# Patient Record
Sex: Female | Born: 1970 | Race: White | Hispanic: No | Marital: Married | Smoking: Never smoker
Health system: Southern US, Community
[De-identification: ages and names within clinical notes are randomized; demographics above are authoritative.]

## PROBLEM LIST (undated history)

## (undated) DIAGNOSIS — L309 Dermatitis, unspecified: Secondary | ICD-10-CM

## (undated) DIAGNOSIS — J302 Other seasonal allergic rhinitis: Secondary | ICD-10-CM

## (undated) HISTORY — PX: ADENOIDECTOMY: SUR15

## (undated) HISTORY — DX: Dermatitis, unspecified: L30.9

## (undated) HISTORY — DX: Other seasonal allergic rhinitis: J30.2

## (undated) HISTORY — PX: WISDOM TOOTH EXTRACTION: SHX21

## (undated) HISTORY — PX: TUBAL LIGATION: SHX77

## (undated) HISTORY — PX: ABLATION ON ENDOMETRIOSIS: SHX5787

---

## 2000-01-16 ENCOUNTER — Ambulatory Visit (HOSPITAL_COMMUNITY): Admission: RE | Admit: 2000-01-16 | Discharge: 2000-01-16 | Payer: Self-pay | Admitting: Obstetrics and Gynecology

## 2000-01-16 ENCOUNTER — Encounter: Payer: Self-pay | Admitting: Obstetrics and Gynecology

## 2001-02-24 ENCOUNTER — Other Ambulatory Visit: Admission: RE | Admit: 2001-02-24 | Discharge: 2001-02-24 | Payer: Self-pay | Admitting: Obstetrics and Gynecology

## 2002-05-10 ENCOUNTER — Other Ambulatory Visit: Admission: RE | Admit: 2002-05-10 | Discharge: 2002-05-10 | Payer: Self-pay | Admitting: Obstetrics and Gynecology

## 2003-10-06 ENCOUNTER — Other Ambulatory Visit: Admission: RE | Admit: 2003-10-06 | Discharge: 2003-10-06 | Payer: Self-pay | Admitting: Obstetrics and Gynecology

## 2004-10-01 ENCOUNTER — Other Ambulatory Visit: Admission: RE | Admit: 2004-10-01 | Discharge: 2004-10-01 | Payer: Self-pay | Admitting: Obstetrics and Gynecology

## 2005-01-15 ENCOUNTER — Ambulatory Visit (HOSPITAL_COMMUNITY): Admission: RE | Admit: 2005-01-15 | Discharge: 2005-01-15 | Payer: Self-pay | Admitting: Obstetrics and Gynecology

## 2005-03-13 ENCOUNTER — Ambulatory Visit (HOSPITAL_COMMUNITY): Admission: RE | Admit: 2005-03-13 | Discharge: 2005-03-13 | Payer: Self-pay | Admitting: Obstetrics and Gynecology

## 2005-03-18 ENCOUNTER — Inpatient Hospital Stay (HOSPITAL_COMMUNITY): Admission: RE | Admit: 2005-03-18 | Discharge: 2005-03-21 | Payer: Self-pay | Admitting: Obstetrics and Gynecology

## 2005-04-30 ENCOUNTER — Other Ambulatory Visit: Admission: RE | Admit: 2005-04-30 | Discharge: 2005-04-30 | Payer: Self-pay | Admitting: Obstetrics and Gynecology

## 2015-05-18 ENCOUNTER — Encounter: Payer: Self-pay | Admitting: Pediatrics

## 2015-05-18 ENCOUNTER — Ambulatory Visit (INDEPENDENT_AMBULATORY_CARE_PROVIDER_SITE_OTHER): Payer: No Typology Code available for payment source | Admitting: Pediatrics

## 2015-05-18 VITALS — BP 108/72 | HR 90 | Temp 98.9°F | Resp 16 | Ht 68.5 in | Wt 171.7 lb

## 2015-05-18 DIAGNOSIS — J3089 Other allergic rhinitis: Secondary | ICD-10-CM

## 2015-05-18 DIAGNOSIS — J0100 Acute maxillary sinusitis, unspecified: Secondary | ICD-10-CM | POA: Diagnosis not present

## 2015-05-18 MED ORDER — CEFDINIR 300 MG PO CAPS: ORAL_CAPSULE | ORAL | Status: AC

## 2015-05-18 NOTE — Patient Instructions (Addendum)
Environmental control of dust and mold Fexofenadine 180 mg once a day for runny nose Nasal saline irrigations at night followed by Nasacort 2 sprays per nostril at night Cefdinir 300 mg capsules-take 2 capsules every 24 hours for a sinus infection for 10 days If your symptoms are not improving, add prednisone 10 mg twice a day for 4 days, 10 mg on the fifth day Keep the [place] pig out of your  bedroom

## 2015-05-18 NOTE — Progress Notes (Signed)
[address] [place] [address] Dept: [phone]  New Patient Note  Patient ID: Becky Cole, female    DOB: 1970-04-14  Age: 45 y.o. MRN: [id number] Date of Office Visit: 05/18/2015 Referring provider: Candice Camp, MD [address] [place], [place] [address]    Chief Complaint: Urticaria  HPI Becky Cole presents for Evaluation of nasal congestion for several years. Her symptoms are perennial. She has aggravation of her symptoms on exposure to dust and the springtime of the year. Recently her [place] pig licked  her and she developed hives locally. She had a cold 10 days ago and is now having a green postnasal drainage and some sinus pressure  Review of Systems  Constitutional: Negative.   HENT:       Perennial nasal congestion for several years  Eyes: Negative.   Respiratory: Negative.   Cardiovascular: Negative.   Gastrointestinal: Negative.   Genitourinary:       Ablations surgery for endometriosis  Musculoskeletal: Negative.   Skin:       Hives where she was licked by a [place] pig  Neurological: Negative.   Endo/Heme/Allergies:       No thyroid disease or diabetes  Psychiatric/Behavioral: Negative.     Outpatient Encounter Prescriptions as of 05/18/2015  Medication Sig  . cefdinir (OMNICEF) 300 MG capsule Take 2 capsules every 24 hours for sinus infection for 10 days  . fexofenadine (ALLEGRA) 180 MG tablet Take 180 mg by mouth daily.  . phentermine (ADIPEX-P) 37.5 MG tablet   . triamcinolone (NASACORT ALLERGY 24HR) 55 MCG/ACT AERO nasal inhaler Place 2 sprays into the nose daily.   No facility-administered encounter medications on file as of 05/18/2015.     Drug Allergies:  Allergies  Allergen Reactions  . Amoxicillin Nausea And Vomiting  . Clarithromycin     Taste buds swelling    Family History: [name]'s family history includes Allergic rhinitis in her father and sister. There is no history of Angioedema, Asthma, Atopy,  Eczema, Immunodeficiency, or Urticaria..  Social and environmental. She has a [place] pig and a parakeet at home. She has never smoked cigarettes. She is not employed outside the home  Physical Exam: BP 108/72 mmHg  Pulse 90  Temp(Src) 98.9 F (37.2 C) (Oral)  Resp 16  Ht 5' 8.5" (1.74 m)  Wt 171 lb 11.8 oz (77.9 kg)  BMI 25.73 kg/m2   Physical Exam  Constitutional: She is oriented to person, place, and time. She appears well-developed and well-nourished.  HENT:  Eyes normal. Ears normal. Nose moderate swelling of nasal turbinates. Pharynx normal except for a green postnasal drainage  Neck: Neck supple.  Cardiovascular:  S1 and S2 normal no murmurs  Pulmonary/Chest:  Clear to percussion and auscultation  Abdominal: Soft. There is no tenderness (no hepatosplenomegaly).  Lymphadenopathy:    She has no cervical adenopathy.  Neurological: She is alert and oriented to person, place, and time.  Skin:  Clear  Psychiatric: She has a normal mood and affect. Her behavior is normal. Judgment and thought content normal.  Vitals reviewed.   Diagnostics:  Allergy skin tests were positive to  molds and [place] pig  Assessment Assessment and Plan: 1. Other allergic rhinitis   2. Acute maxillary sinusitis, recurrence not specified     Meds ordered this encounter  Medications  . cefdinir (OMNICEF) 300 MG capsule    Sig: Take 2 capsules every 24 hours for sinus infection for 10 days    Dispense:  20 capsule    Refill:  0    Patient Instructions  Environmental control of dust and mold Fexofenadine 180 mg once a day for runny nose Nasal saline irrigations at night followed by Nasacort 2 sprays per nostril at night Cefdinir 300 mg capsules-take 2 capsules every 24 hours for a sinus infection for 10 days If your symptoms are not improving, add prednisone 10 mg twice a day for 4 days, 10 mg on the fifth day Keep the [place] pig out of your  bedroom    Return in about 6 weeks (around  06/29/2015).   Thank you for the opportunity to care for this patient.  Please do not hesitate to contact me with questions.  Tonette BihariJ. A. Sohum Delillo, M.D.  [facility] [address] [place], [place] [address] [phone]

## 2015-06-14 ENCOUNTER — Telehealth: Payer: Self-pay | Admitting: Allergy

## 2015-06-14 NOTE — Telephone Encounter (Signed)
Becky Cole CALLED AND WANTED US TO MAIL HER ALLERGY TESTING TO HER. MAILED ON 06/15/15.

## 2015-07-04 ENCOUNTER — Ambulatory Visit (INDEPENDENT_AMBULATORY_CARE_PROVIDER_SITE_OTHER): Payer: No Typology Code available for payment source | Admitting: Pediatrics

## 2015-07-04 ENCOUNTER — Encounter: Payer: Self-pay | Admitting: Pediatrics

## 2015-07-04 VITALS — BP 110/72 | HR 96 | Temp 98.7°F | Resp 20

## 2015-07-04 DIAGNOSIS — J3081 Allergic rhinitis due to animal (cat) (dog) hair and dander: Secondary | ICD-10-CM | POA: Insufficient documentation

## 2015-07-04 NOTE — Patient Instructions (Addendum)
Continue on your current medications Call me if you're not doing well on this treatment plan.  Keep the [place] pig out of her bedroom

## 2015-07-04 NOTE — Progress Notes (Signed)
  [address] [place] [address] Dept: [phone]  FOLLOW UP NOTE  Patient ID: Becky Cole, female    DOB: 1970/04/27  Age: 45 y.o. MRN: [id number] Date of Office Visit: 07/04/2015  Assessment Chief Complaint: Follow-up  HPI Becky Cole presents for for follow-up of allergic rhinitis. She had a sinus infection noted during the last visit and she took  Cefdinir 600 mg every 24 hours and 5 days later she developed some diarrhea so she stopped it. Her symptoms have been much improved. She did use prednisone 10 mg twice a day for 4 days 10 mg on the fifth day.  Current medications are fexofenadine 180 mg in the morning, Nasacort 2 sprays per nostril at night Phentermine 37.5 mg once a day   Drug Allergies:  Allergies  Allergen Reactions  . Amoxicillin Nausea And Vomiting  . Cefdinir     Upset stomach, diarrhea  . Clarithromycin     Taste buds swelling    Physical Exam: BP 110/72 mmHg  Pulse 96  Temp(Src) 98.7 F (37.1 C) (Oral)  Resp 20   Physical Exam  Constitutional: She appears well-developed and well-nourished.  HENT:  Eyes normal. Ears normal. Nose normal. Pharynx normal.  Neck: Neck supple.  Cardiovascular:  S1 and S2 normal no murmurs  Pulmonary/Chest:  Clear to percussion and auscultation  Lymphadenopathy:    She has no cervical adenopathy.  Psychiatric: She has a normal mood and affect. Her behavior is normal. Judgment and thought content normal.  Vitals reviewed.   Diagnostics:  none  Assessment and Plan: 1. Allergic rhinitis due to animal hair and dander         Patient Instructions  Continue on your current medications Call me if you're not doing well on this treatment plan    Return in about 1 year (around 07/03/2016).    Thank you for the opportunity to care for this patient.  Please do not hesitate to contact me with questions.  Tonette BihariJ. A. Deema Juncaj, M.D.  [facility] [address] [place], [place] [address] [phone]

## 2018-04-27 ENCOUNTER — Other Ambulatory Visit: Payer: Self-pay | Admitting: Obstetrics and Gynecology

## 2018-04-27 DIAGNOSIS — R928 Other abnormal and inconclusive findings on diagnostic imaging of breast: Secondary | ICD-10-CM

## 2018-04-28 ENCOUNTER — Other Ambulatory Visit: Payer: Self-pay

## 2018-04-28 ENCOUNTER — Ambulatory Visit
Admission: RE | Admit: 2018-04-28 | Discharge: 2018-04-28 | Disposition: A | Discharge status: Home / Self Care | Payer: PRIVATE HEALTH INSURANCE | Source: Ambulatory Visit | Attending: Obstetrics and Gynecology | Admitting: Obstetrics and Gynecology

## 2018-04-28 ENCOUNTER — Other Ambulatory Visit: Payer: Self-pay | Admitting: Obstetrics and Gynecology

## 2018-04-28 DIAGNOSIS — R928 Other abnormal and inconclusive findings on diagnostic imaging of breast: Secondary | ICD-10-CM

## 2018-04-28 DIAGNOSIS — R921 Mammographic calcification found on diagnostic imaging of breast: Secondary | ICD-10-CM

## 2018-11-02 ENCOUNTER — Ambulatory Visit
Admission: RE | Admit: 2018-11-02 | Discharge: 2018-11-02 | Disposition: A | Discharge status: Home / Self Care | Payer: PRIVATE HEALTH INSURANCE | Source: Ambulatory Visit | Attending: Obstetrics and Gynecology | Admitting: Obstetrics and Gynecology

## 2018-11-02 ENCOUNTER — Other Ambulatory Visit: Payer: Self-pay

## 2018-11-02 ENCOUNTER — Other Ambulatory Visit: Payer: Self-pay | Admitting: Obstetrics and Gynecology

## 2018-11-02 DIAGNOSIS — R921 Mammographic calcification found on diagnostic imaging of breast: Secondary | ICD-10-CM

## 2019-05-10 ENCOUNTER — Ambulatory Visit
Admission: RE | Admit: 2019-05-10 | Discharge: 2019-05-10 | Disposition: A | Discharge status: Home / Self Care | Payer: PRIVATE HEALTH INSURANCE | Source: Ambulatory Visit | Attending: Obstetrics and Gynecology | Admitting: Obstetrics and Gynecology

## 2019-05-10 ENCOUNTER — Other Ambulatory Visit: Payer: Self-pay

## 2019-05-10 DIAGNOSIS — R921 Mammographic calcification found on diagnostic imaging of breast: Secondary | ICD-10-CM

## 2019-11-19 ENCOUNTER — Other Ambulatory Visit: Payer: Self-pay | Admitting: Obstetrics and Gynecology

## 2019-11-19 DIAGNOSIS — R921 Mammographic calcification found on diagnostic imaging of breast: Secondary | ICD-10-CM

## 2020-05-11 ENCOUNTER — Ambulatory Visit
Admission: RE | Admit: 2020-05-11 | Discharge: 2020-05-11 | Disposition: A | Discharge status: Home / Self Care | Payer: PRIVATE HEALTH INSURANCE | Source: Ambulatory Visit | Attending: Obstetrics and Gynecology | Admitting: Obstetrics and Gynecology

## 2020-05-11 ENCOUNTER — Other Ambulatory Visit: Payer: Self-pay

## 2020-05-11 DIAGNOSIS — R921 Mammographic calcification found on diagnostic imaging of breast: Secondary | ICD-10-CM

## 2021-07-02 ENCOUNTER — Other Ambulatory Visit: Payer: Self-pay | Admitting: Obstetrics and Gynecology

## 2021-07-02 DIAGNOSIS — R928 Other abnormal and inconclusive findings on diagnostic imaging of breast: Secondary | ICD-10-CM

## 2021-07-11 ENCOUNTER — Ambulatory Visit
Admission: RE | Admit: 2021-07-11 | Discharge: 2021-07-11 | Disposition: A | Discharge status: Home / Self Care | Payer: PRIVATE HEALTH INSURANCE | Source: Ambulatory Visit | Attending: Obstetrics and Gynecology | Admitting: Obstetrics and Gynecology

## 2021-07-11 ENCOUNTER — Other Ambulatory Visit: Payer: Self-pay | Admitting: Obstetrics and Gynecology

## 2021-07-11 ENCOUNTER — Ambulatory Visit
Admission: RE | Admit: 2021-07-11 | Discharge: 2021-07-11 | Disposition: A | Discharge status: Home / Self Care | Payer: Self-pay | Source: Ambulatory Visit | Attending: Obstetrics and Gynecology | Admitting: Obstetrics and Gynecology

## 2021-07-11 DIAGNOSIS — R928 Other abnormal and inconclusive findings on diagnostic imaging of breast: Secondary | ICD-10-CM

## 2021-07-11 DIAGNOSIS — N6320 Unspecified lump in the left breast, unspecified quadrant: Secondary | ICD-10-CM

## 2021-07-17 ENCOUNTER — Ambulatory Visit
Admission: RE | Admit: 2021-07-17 | Discharge: 2021-07-17 | Disposition: A | Discharge status: Home / Self Care | Payer: PRIVATE HEALTH INSURANCE | Source: Ambulatory Visit | Attending: Obstetrics and Gynecology | Admitting: Obstetrics and Gynecology

## 2021-07-17 DIAGNOSIS — N6320 Unspecified lump in the left breast, unspecified quadrant: Secondary | ICD-10-CM

## 2021-12-19 ENCOUNTER — Other Ambulatory Visit: Payer: Self-pay | Admitting: Obstetrics and Gynecology

## 2021-12-19 DIAGNOSIS — R921 Mammographic calcification found on diagnostic imaging of breast: Secondary | ICD-10-CM

## 2022-02-12 ENCOUNTER — Ambulatory Visit
Admission: RE | Admit: 2022-02-12 | Discharge: 2022-02-12 | Disposition: A | Discharge status: Home / Self Care | Payer: PRIVATE HEALTH INSURANCE | Source: Ambulatory Visit | Attending: Obstetrics and Gynecology | Admitting: Obstetrics and Gynecology

## 2022-02-12 ENCOUNTER — Ambulatory Visit: Payer: PRIVATE HEALTH INSURANCE

## 2022-02-12 DIAGNOSIS — R921 Mammographic calcification found on diagnostic imaging of breast: Secondary | ICD-10-CM

## 2022-06-19 ENCOUNTER — Encounter: Payer: Self-pay | Admitting: Obstetrics and Gynecology

## 2022-06-19 DIAGNOSIS — Z1231 Encounter for screening mammogram for malignant neoplasm of breast: Secondary | ICD-10-CM

## 2022-10-28 IMAGING — US US BREAST BX W LOC DEV 1ST LESION IMG BX SPEC US GUIDE*L*
1 series · 12 of 12 positions shown · non-contrast
Comparison: None Available.
COMPARISON: None Available.

Addendum:
CLINICAL DATA: 50-year-old female presenting for ultrasound-guided
biopsy of a left breast mass.

EXAM:
ULTRASOUND GUIDED LEFT BREAST CORE NEEDLE BIOPSY

[Series 1: us breast bx w loc dev 1st lesion img bx spec us g · 0.06mm/px · 12 of 12 slices shown]
[im 1/12]
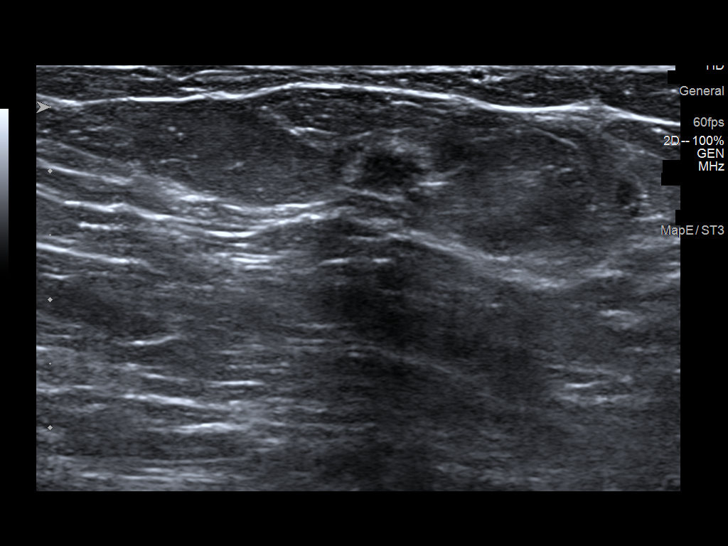
[im 2/12]
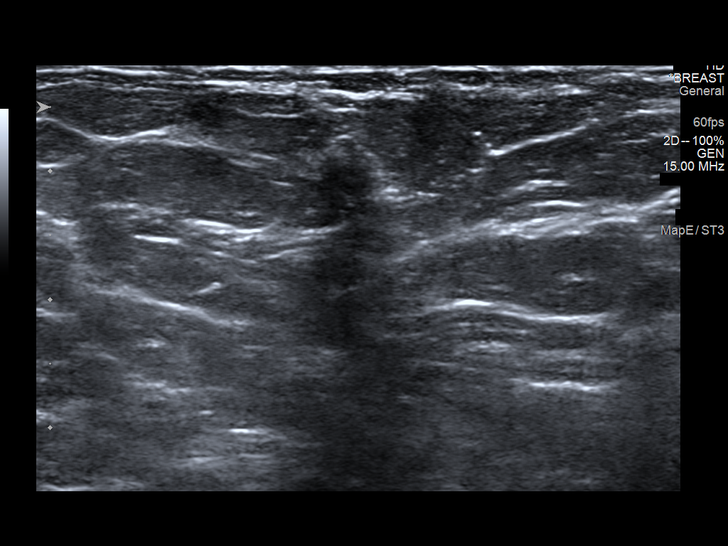
[im 3/12]
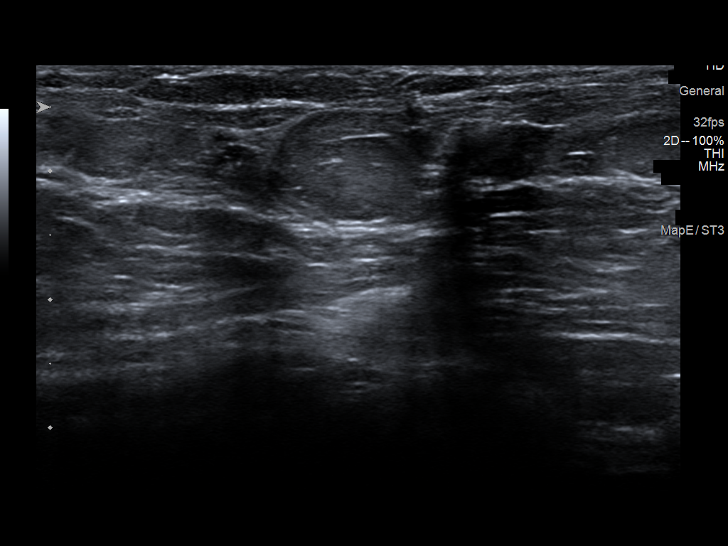
[im 4/12]
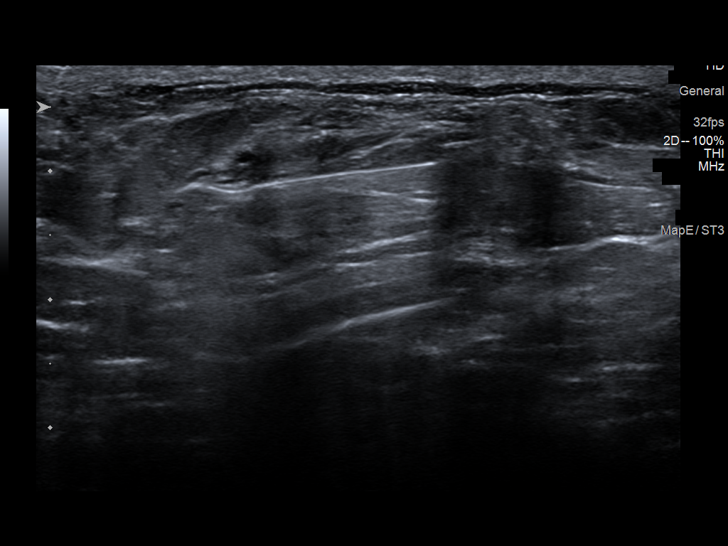
[im 5/12]
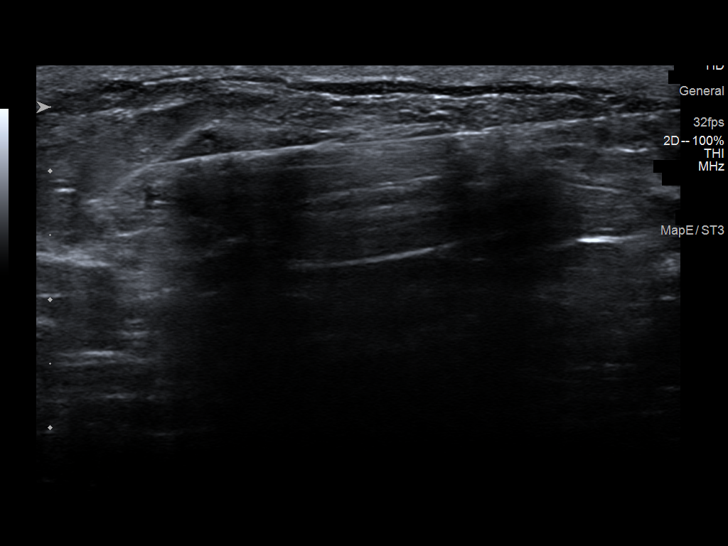
[im 6/12]
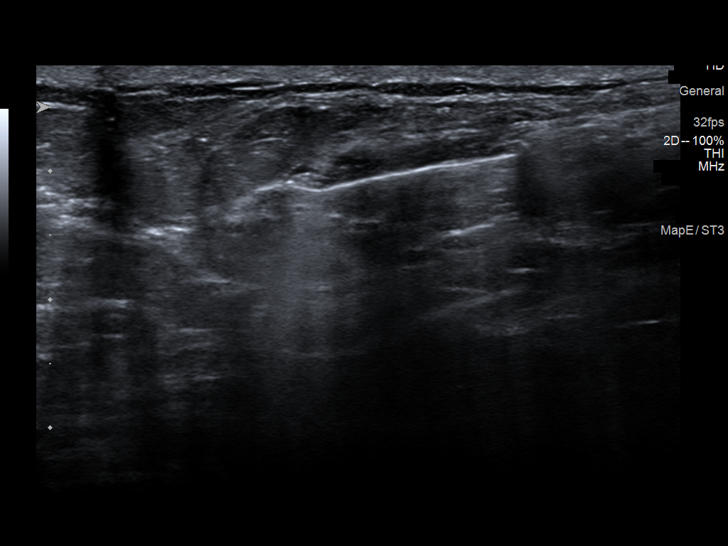
[im 7/12]
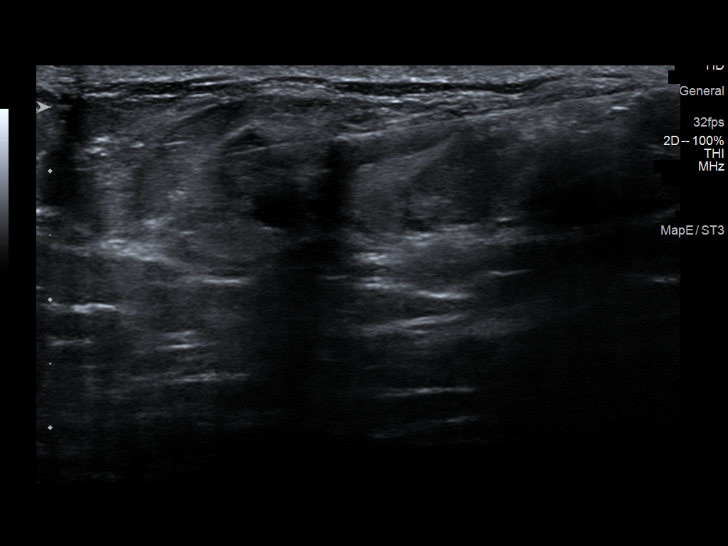
[im 8/12]
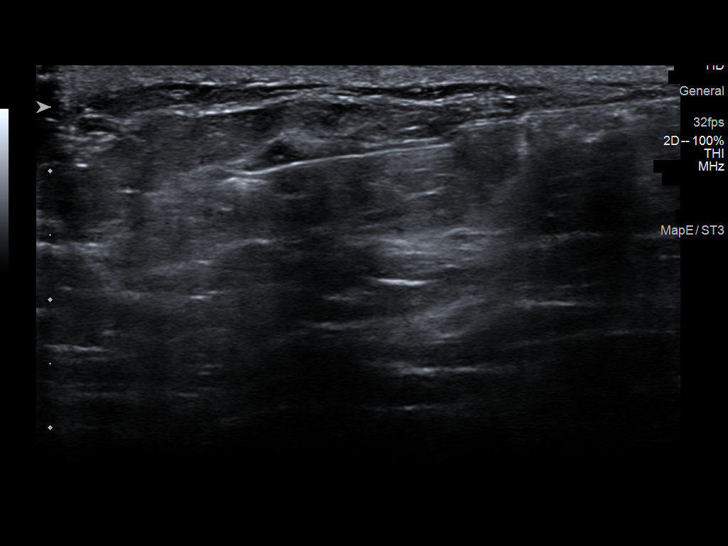
[im 9/12]
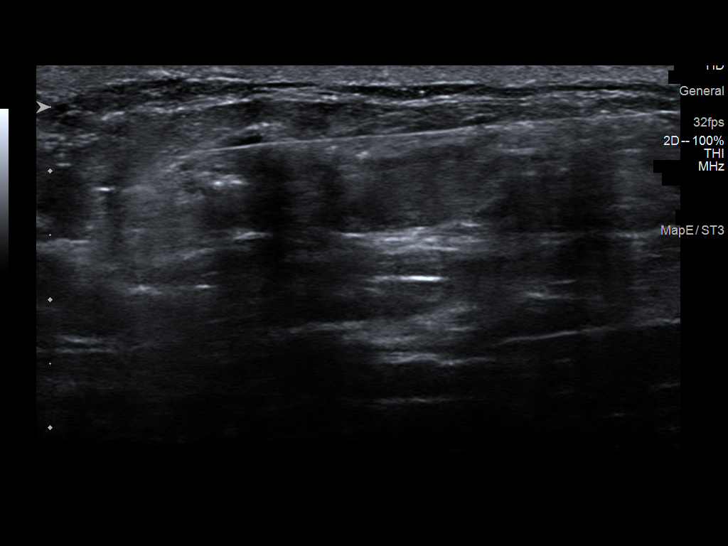
[im 10/12]
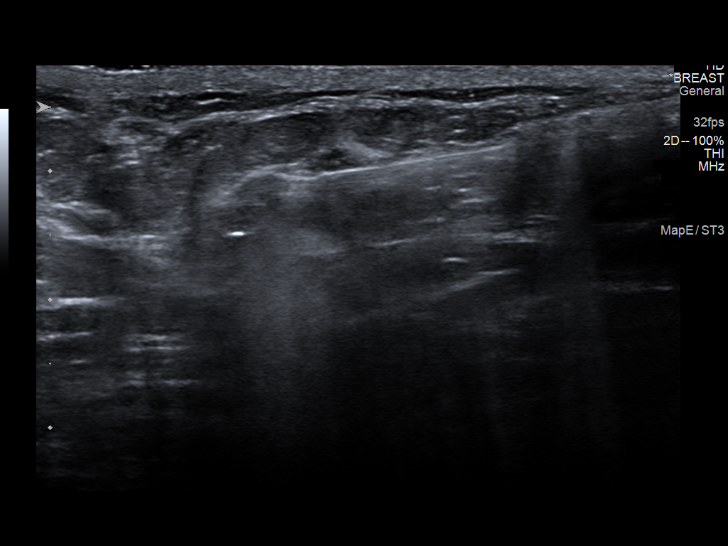
[im 11/12]
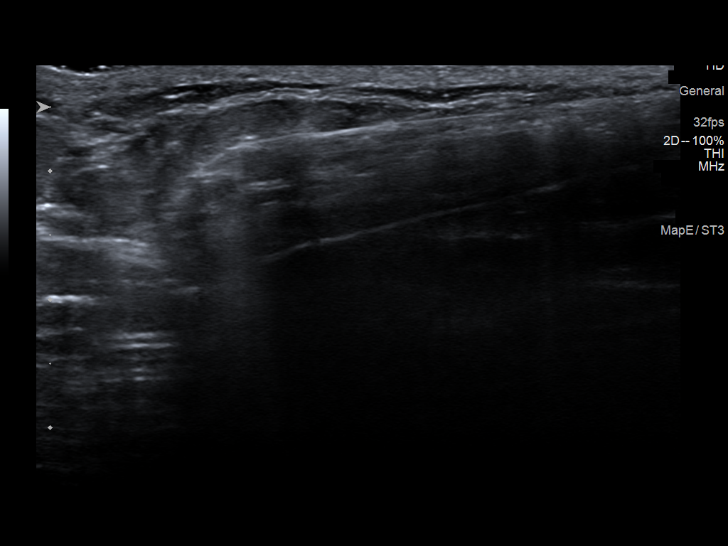
[im 12/12]
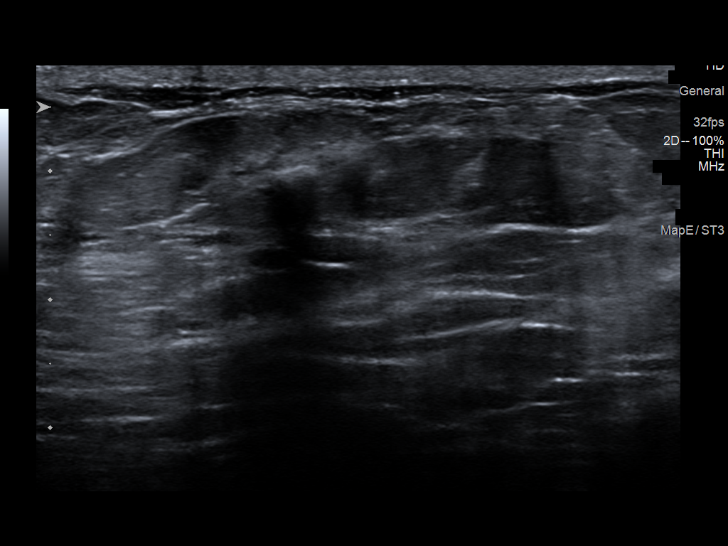

[12 of 12 positions shown; findings below may reference images not displayed]



Lesion quadrant: Lower inner quadrant

Using sterile technique and 1% Lidocaine as local anesthetic, under
direct ultrasound visualization, a 14 gauge Bhalla device was
used to perform biopsy of a mass in the left breast at 6 o'clock, 5
cm from the nipple using a approach. At the conclusion of the
procedure a ribbon shaped tissue marker clip was deployed into the
biopsy cavity. Follow up 2 view mammogram was performed and dictated
separately.
IMPRESSION: Ultrasound guided biopsy of a left breast mass at 6 o'clock. No
apparent complications.

ADDENDUM:
Pathology revealed PSEUDOANGIOMATOUS STROMAL HYPERPLASIA (PASH),
FIBROCYSTIC CHANGES of the LEFT breast, 6 o'clock, 2cmfn (ribbon
clip). This was found to be concordant by Dr. Mousa Obsie Doualeh.

Pathology results were discussed with the patient by telephone. The
patient reported doing well after the biopsy with tenderness at the
site. Post biopsy instructions and care were reviewed and questions
were answered. The patient was encouraged to call The [REDACTED] for any additional concerns. My direct phone
number was provided.

The patient was asked to return for LEFT diagnostic mammography and
ultrasound in 6 months and informed a reminder notice would be sent
regarding this appointment.

Pathology results reported by Deiliane Jordania, RN on 07/19/2021.



Lesion quadrant: Lower inner quadrant

Using sterile technique and 1% Lidocaine as local anesthetic, under
direct ultrasound visualization, a 14 gauge Bhalla device was
used to perform biopsy of a mass in the left breast at 6 o'clock, 5
cm from the nipple using a approach. At the conclusion of the
procedure a ribbon shaped tissue marker clip was deployed into the
biopsy cavity. Follow up 2 view mammogram was performed and dictated
separately.
IMPRESSION: Ultrasound guided biopsy of a left breast mass at 6 o'clock. No
apparent complications.

## 2022-10-28 IMAGING — MG MM BREAST LOCALIZATION CLIP
4 series · 4 of 12 positions shown · non-contrast
Comparison: Previous exam(s).

CLINICAL DATA: Post biopsy mammogram of the left breast for clip
placement.

EXAM:
3D DIAGNOSTIC LEFT MAMMOGRAM POST ULTRASOUND BIOPSY

[L CC synth-2D]
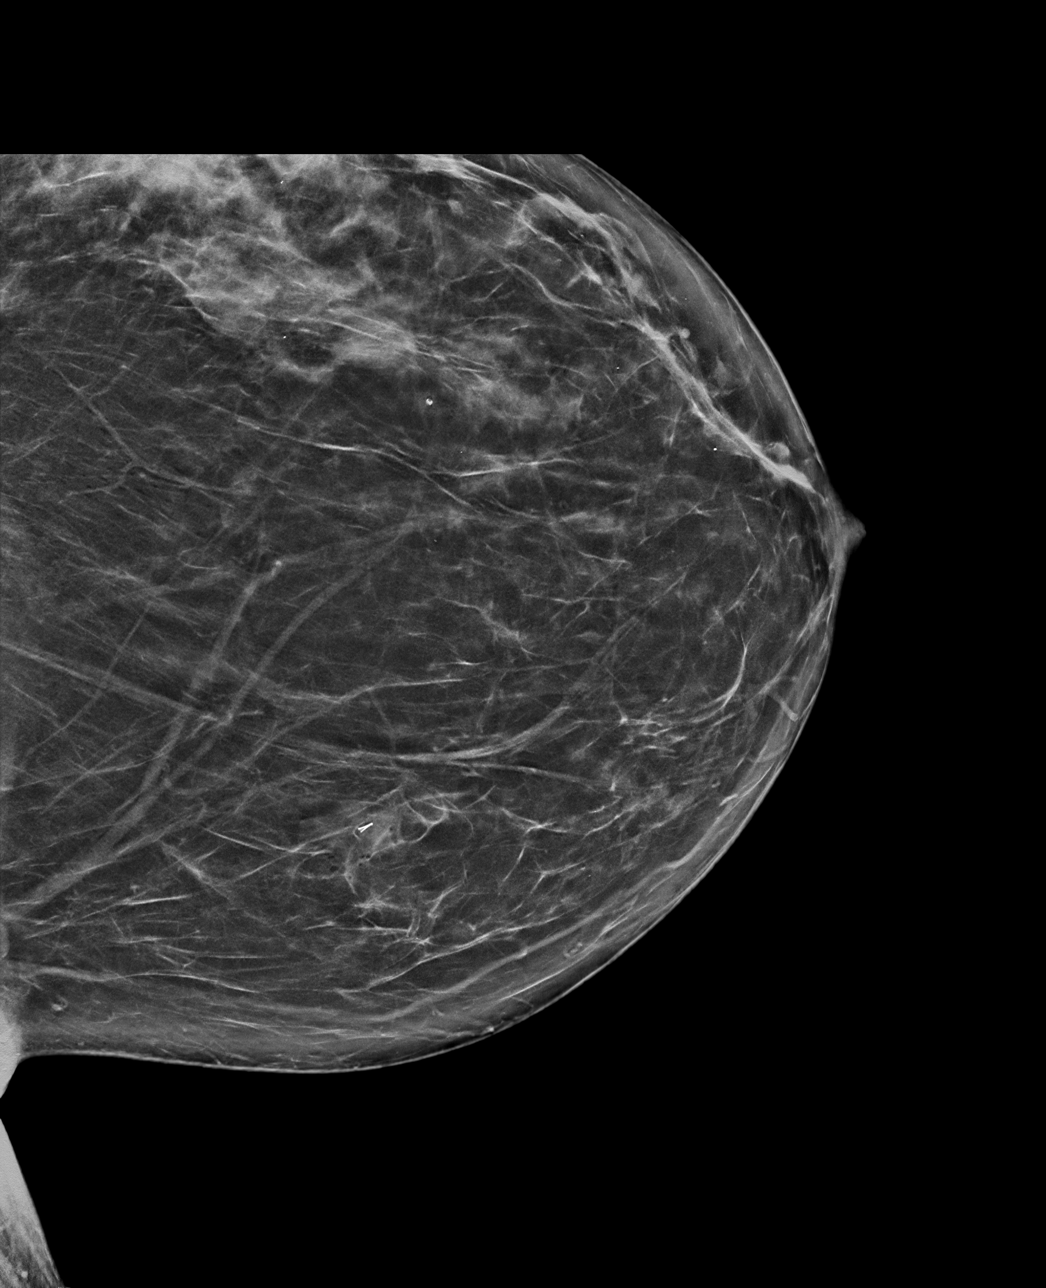

[L ML synth-2D]
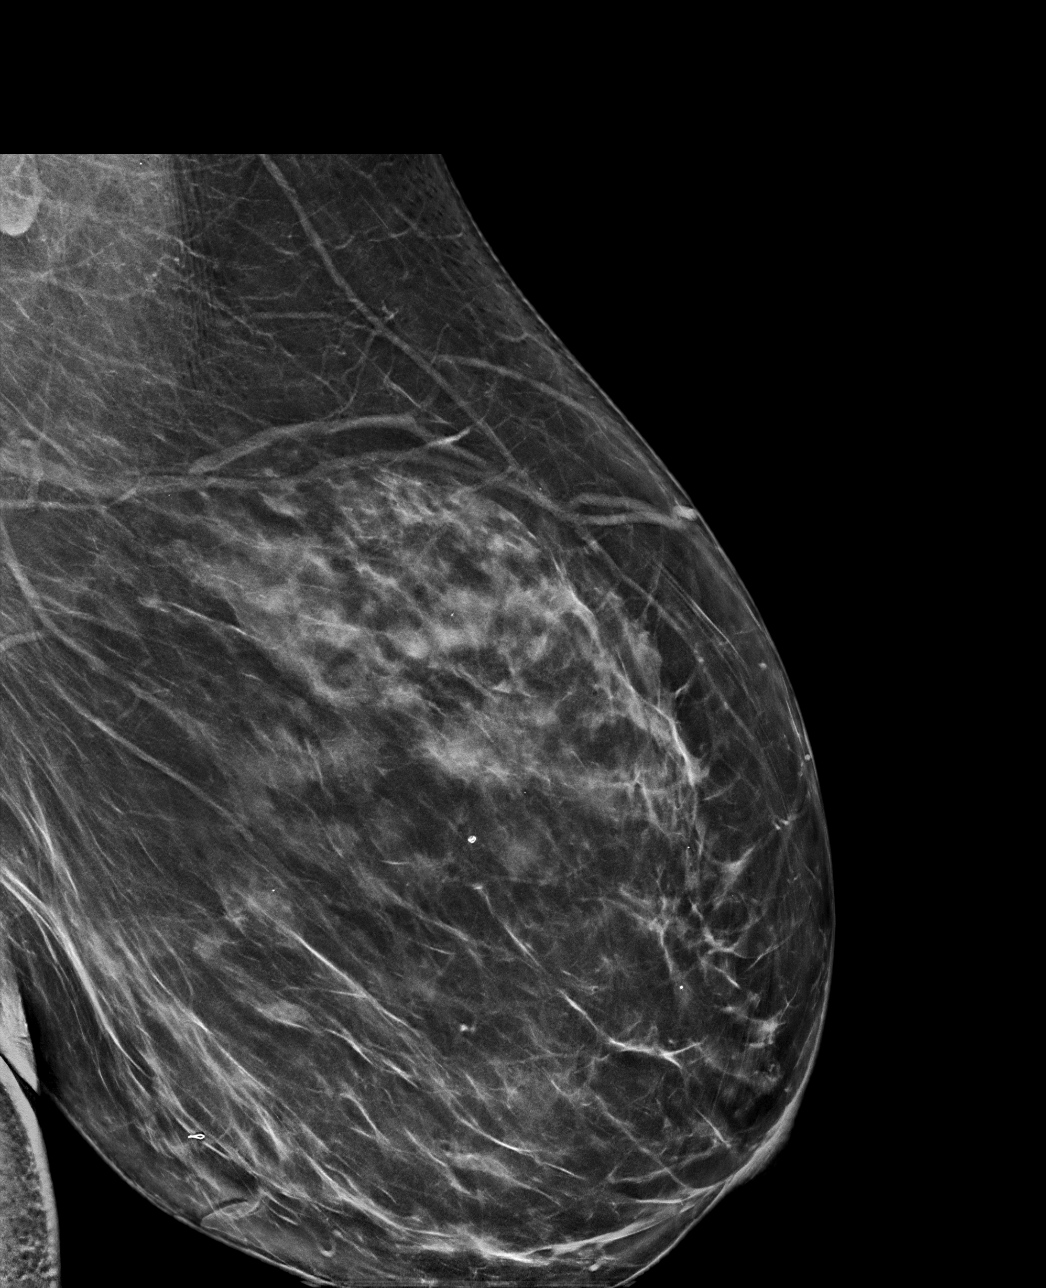

[L CC tomo · tomo slice 39/76.0]
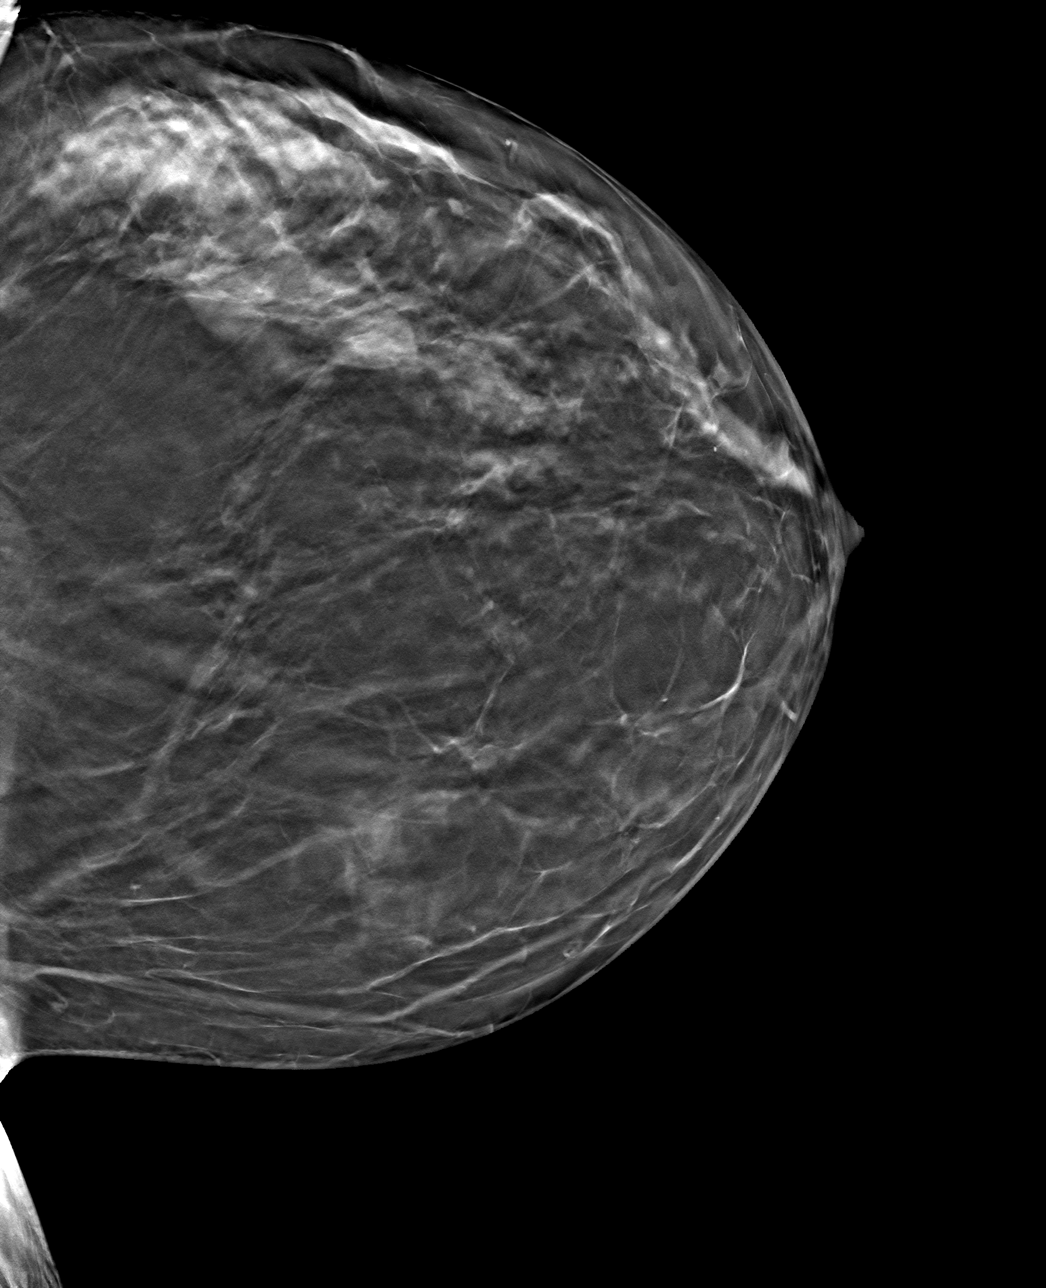

[L ML tomo · tomo slice 43/84.0]
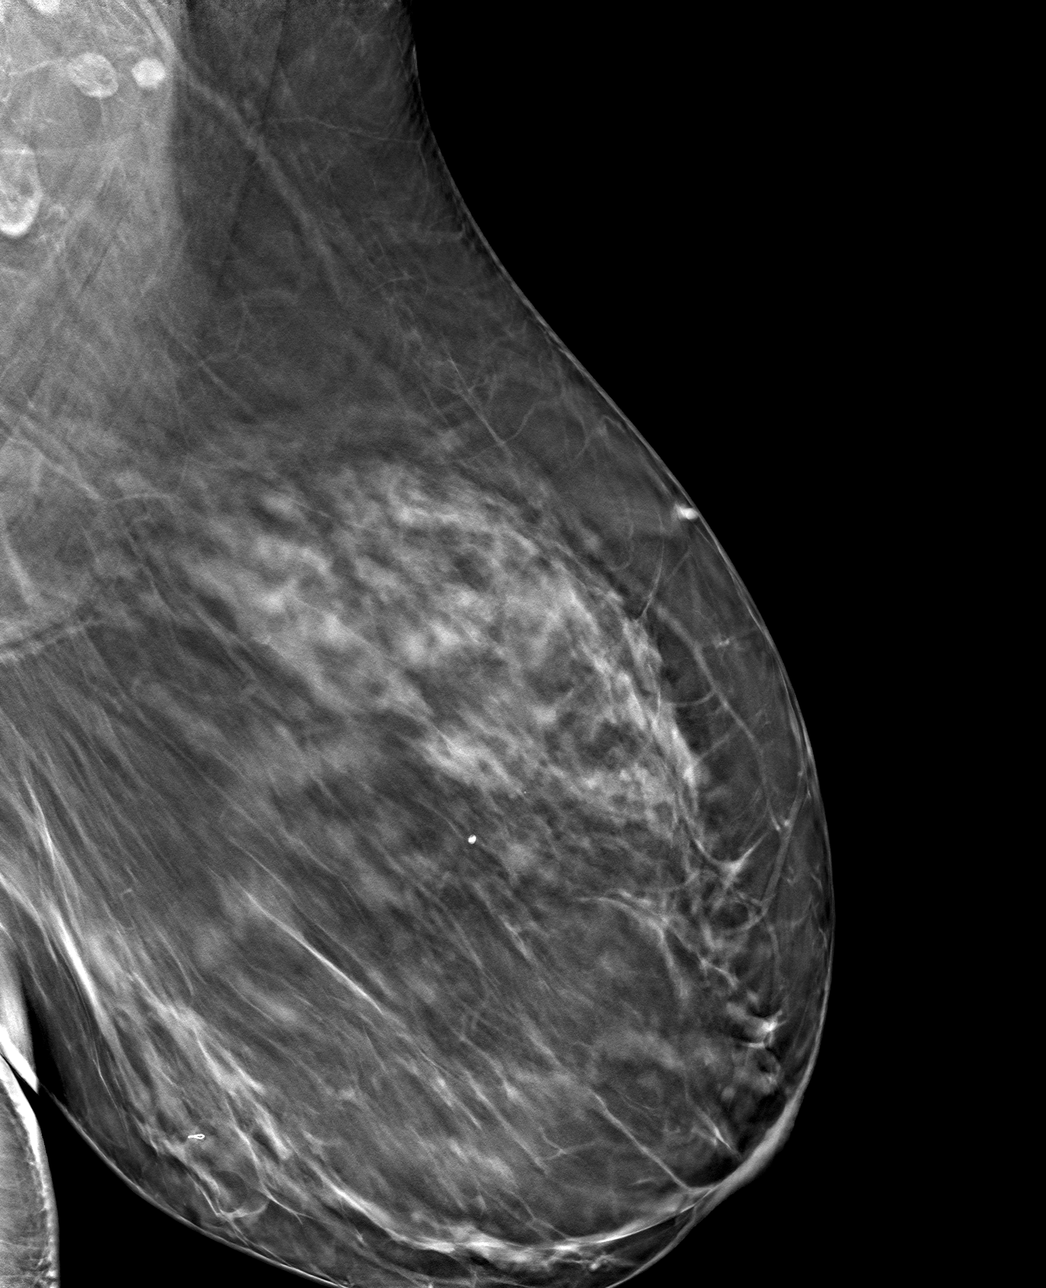

[4 of 12 positions shown; findings below may reference images not displayed]

FINDINGS: 3D Mammographic images were obtained following ultrasound guided
biopsy of the inferior left breast. The biopsy marking clip is in
expected position at the site of biopsy.
IMPRESSION: Appropriate positioning of the ribbon shaped biopsy marking clip at
the site of biopsy in the inferior left breast.

Final Assessment: Post Procedure Mammograms for Marker Placement
